# Patient Record
Sex: Female | Born: 1995 | Race: White | Hispanic: No | Marital: Single | Smoking: Never smoker
Health system: Southern US, Community
[De-identification: ages and names within clinical notes are randomized; demographics above are authoritative.]

## PROBLEM LIST (undated history)

## (undated) DIAGNOSIS — R51 Headache: Secondary | ICD-10-CM

## (undated) HISTORY — DX: Headache: R51

---
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
## 2017-03-31 DIAGNOSIS — S060X0A Concussion without loss of consciousness, initial encounter: Secondary | ICD-10-CM | POA: Diagnosis not present

## 2017-03-31 DIAGNOSIS — S0033XA Contusion of nose, initial encounter: Secondary | ICD-10-CM | POA: Diagnosis not present

## 2017-06-07 ENCOUNTER — Ambulatory Visit: Payer: BLUE CROSS/BLUE SHIELD | Admitting: Emergency Medicine

## 2017-06-07 ENCOUNTER — Encounter: Payer: Self-pay | Admitting: Emergency Medicine

## 2017-06-07 ENCOUNTER — Other Ambulatory Visit: Payer: Self-pay

## 2017-06-07 VITALS — BP 106/70 | HR 100 | Temp 98.2°F | Resp 16 | Ht 63.25 in | Wt 154.4 lb

## 2017-06-07 DIAGNOSIS — R6889 Other general symptoms and signs: Secondary | ICD-10-CM | POA: Insufficient documentation

## 2017-06-07 DIAGNOSIS — J111 Influenza due to unidentified influenza virus with other respiratory manifestations: Secondary | ICD-10-CM | POA: Diagnosis not present

## 2017-06-07 LAB — POC INFLUENZA A&B (BINAX/QUICKVUE)
Influenza A, POC: POSITIVE — AB
Influenza B, POC: NEGATIVE

## 2017-06-07 MED ORDER — ALBUTEROL SULFATE HFA 108 (90 BASE) MCG/ACT IN AERS: 2.0000 | INHALATION_SPRAY | Freq: Four times a day (QID) | RESPIRATORY_TRACT | 0 refills | Status: AC | PRN

## 2017-06-07 MED ORDER — OSELTAMIVIR PHOSPHATE 75 MG PO CAPS: 75.0000 mg | ORAL_CAPSULE | Freq: Two times a day (BID) | ORAL | 0 refills | Status: AC

## 2017-06-07 NOTE — Progress Notes (Signed)
Kathryn Spencer 21 y.o.   Chief Complaint  Patient presents with  . Cough    NONPRODUCTIVE x 2 days  . Sinus Problem    with nasal drainage    HISTORY OF PRESENT ILLNESS: This is a 22 y.o. female complaining of sore throat, cough productive of clear mucus, runny nose, low-grade fever that started 2 days ago.  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  Went to see school nurse last Monday and instructed to take ibuprofen as needed.  Feels a little worse today.  Healthy patient with no chronic medical problems.  Influenza  This is a new problem. The current episode started in the past 7 days. The problem occurs constantly. The problem has been gradually worsening. Associated symptoms include congestion, coughing, a fever, myalgias, a sore throat and weakness. Pertinent negatives include no abdominal pain, anorexia, arthralgias, change in bowel habit, chest pain, chills, fatigue, headaches, joint swelling, nausea, neck pain, rash, swollen glands, urinary symptoms, vertigo, visual change or vomiting. Nothing aggravates the symptoms. She has tried NSAIDs for the symptoms. The treatment provided no relief.     Prior to Admission medications   Medication Sig Start Date End Date Taking? Authorizing Provider  cefUROXime (CEFTIN) 500 MG tablet  11/20/12   [provider]  clindamycin-benzoyl peroxide (BENZACLIN) gel  11/21/12   [provider]  ondansetron (ZOFRAN ODT) 4 MG disintegrating tablet Take 1 tablet (4 mg total) by mouth every 8 (eight) hours as needed for nausea or vomiting. Patient not taking: Reported on 06/07/2017 01/09/13   Elveria Rising, NP  topiramate (TOPAMAX) 25 MG tablet Take 1 tablet in the morning and 2 tablets at bedtime Patient not taking: Reported on 06/07/2017 02/07/13   Elveria Rising, NP  triamcinolone (KENALOG) 0.1 % paste  11/26/12   [provider]    No Known Allergies  Patient Active Problem List   Diagnosis Date Noted  . Migraine without aura,  without mention of intractable migraine without mention of status migrainosus 01/03/2013  . Chronic tension type headache 01/03/2013  . Cold hands and feet without peripheral vascular disease 01/03/2013    Past Medical History:  Diagnosis Date  . ZOXWRUEA(540.9)     Past Surgical History:  Procedure Laterality Date  . TONSILLECTOMY AND ADENOIDECTOMY  2008    Social History   Socioeconomic History  . Marital status: Single    Spouse name: Not on file  . Number of children: Not on file  . Years of education: Not on file  . Highest education level: Not on file  Occupational History  . Not on file  Social Needs  . Financial resource strain: Not on file  . Food insecurity:    Worry: Not on file    Inability: Not on file  . Transportation needs:    Medical: Not on file    Non-medical: Not on file  Tobacco Use  . Smoking status: Never Smoker  . Smokeless tobacco: Never Used  Substance and Sexual Activity  . Alcohol use: No  . Drug use: No  . Sexual activity: Never  Lifestyle  . Physical activity:    Days per week: Not on file    Minutes per session: Not on file  . Stress: Not on file  Relationships  . Social connections:    Talks on phone: Not on file    Gets together: Not on file    Attends religious service: Not on file    Active member of club or organization: Not on  file    Attends meetings of clubs or organizations: Not on file    Relationship status: Not on file  . Intimate partner violence:    Fear of current or ex partner: Not on file    Emotionally abused: Not on file    Physically abused: Not on file    Forced sexual activity: Not on file  Other Topics Concern  . Not on file  Social History Narrative  . Not on file    No family history on file.   Review of Systems  Constitutional: Positive for fever. Negative for chills and fatigue.  HENT: Positive for congestion and sore throat.   Eyes: Negative.  Negative for blurred vision and double vision.   Respiratory: Positive for cough.   Cardiovascular: Negative.  Negative for chest pain.  Gastrointestinal: Negative.  Negative for abdominal pain, anorexia, change in bowel habit, diarrhea, nausea and vomiting.  Musculoskeletal: Positive for myalgias. Negative for arthralgias, joint swelling and neck pain.  Skin: Negative for rash.  Neurological: Positive for weakness. Negative for dizziness, vertigo and headaches.  Endo/Heme/Allergies: Negative.   All other systems reviewed and are negative.   Vitals:   06/07/17 1208  BP: 106/70  Pulse: 100  Resp: 16  Temp: 98.2 F (36.8 C)  SpO2: 99%    Physical Exam  Constitutional: She is oriented to person, place, and time. She appears well-developed and well-nourished.  HENT:  Head: Normocephalic and atraumatic.  Right Ear: External ear normal.  Left Ear: External ear normal.  Nose: Nose normal.  Mouth/Throat: Oropharynx is clear and moist.  Eyes: Pupils are equal, round, and reactive to light. Conjunctivae and EOM are normal.  Neck: Normal range of motion. Neck supple. No thyromegaly present.  Cardiovascular: Normal rate, regular rhythm and normal heart sounds.  Pulmonary/Chest: Effort normal and breath sounds normal.  Abdominal: Soft. Bowel sounds are normal. She exhibits no distension. There is no tenderness.  Lymphadenopathy:    She has no cervical adenopathy.  Neurological: She is alert and oriented to person, place, and time. No sensory deficit. She exhibits normal muscle tone.  Skin: Skin is warm and dry. Capillary refill takes less than 2 seconds.  Psychiatric: She has a normal mood and affect. Her behavior is normal.  Vitals reviewed.    ASSESSMENT & PLAN: Kathryn Spencer was seen today for cough and sinus problem.  Diagnoses and all orders for this visit:  Influenza with respiratory manifestation -     oseltamivir (TAMIFLU) 75 MG capsule; Take 1 capsule (75 mg total) by mouth 2 (two) times daily for 5 days.  Flu-like  symptoms -     POC Influenza A&B(BINAX/QUICKVUE)    Patient Instructions       IF you received an x-ray today, you will receive an invoice from [facility]. Please contact [facility] at [phone] with questions or concerns regarding your invoice.   IF you received labwork today, you will receive an invoice from [facility]. Please contact [facility] at [phone] with questions or concerns regarding your invoice.   Our billing staff will not be able to assist you with questions regarding bills from these companies.  You will be contacted with the lab results as soon as they are available. The fastest way to get your results is to activate your My Chart account. Instructions are located on the last page of this paperwork. If you have not heard from [place] regarding the results in 2 weeks, please contact this office.     Influenza,  Adult Influenza ("the flu") is an infection in the lungs, nose, and throat (respiratory tract). It is caused by a virus. The flu causes many common cold symptoms, as well as a high fever and body aches. It can make you feel very sick. The flu spreads easily from person to person (is contagious). Getting a flu shot (influenza vaccination) every year is the best way to prevent the flu. Follow these instructions at home:  Take over-the-counter and prescription medicines only as told by your doctor.  Use a cool mist humidifier to add moisture (humidity) to the air in your home. This can make it easier to breathe.  Rest as needed.  Drink enough fluid to keep your pee (urine) clear or pale yellow.  Cover your mouth and nose when you cough or sneeze.  Wash your hands with soap and water often, especially after you cough or sneeze. If you cannot use soap and water, use hand sanitizer.  Stay home from work or school as told by your doctor. Unless you are visiting your doctor, try to avoid leaving home until your fever has been gone for 24 hours  without the use of medicine.  Keep all follow-up visits as told by your doctor. This is important. How is this prevented?  Getting a yearly (annual) flu shot is the best way to avoid getting the flu. You may get the flu shot in late summer, fall, or winter. Ask your doctor when you should get your flu shot.  Wash your hands often or use hand sanitizer often.  Avoid contact with people who are sick during cold and flu season.  Eat healthy foods.  Drink plenty of fluids.  Get enough sleep.  Exercise regularly. Contact a doctor if:  You get new symptoms.  You have: ? Chest pain. ? Watery poop (diarrhea). ? A fever.  Your cough gets worse.  You start to have more mucus.  You feel sick to your stomach (nauseous).  You throw up (vomit). Get help right away if:  You start to be short of breath or have trouble breathing.  Your skin or nails turn a bluish color.  You have very bad pain or stiffness in your neck.  You get a sudden headache.  You get sudden pain in your face or ear.  You cannot stop throwing up. This information is not intended to replace advice given to you by your health care provider. Make sure you discuss any questions you have with your health care provider. Document Released: 12/01/2007 Document Revised: 07/30/2015 Document Reviewed: 12/16/2014 Elsevier Interactive Patient Education  2017 Elsevier Inc.      Edwina BarthMiguel Brenna Friesenhahn, MD [facility]

## 2017-06-07 NOTE — Patient Instructions (Addendum)
     IF you received an x-ray today, you will receive an invoice from [facility]. Please contact [facility] at [phone] with questions or concerns regarding your invoice.   IF you received labwork today, you will receive an invoice from [facility]. Please contact [facility] at [phone] with questions or concerns regarding your invoice.   Our billing staff will not be able to assist you with questions regarding bills from these companies.  You will be contacted with the lab results as soon as they are available. The fastest way to get your results is to activate your My Chart account. Instructions are located on the last page of this paperwork. If you have not heard from us regarding the results in 2 weeks, please contact this office.      Influenza, Adult Influenza ("the flu") is an infection in the lungs, nose, and throat (respiratory tract). It is caused by a virus. The flu causes many common cold symptoms, as well as a high fever and body aches. It can make you feel very sick. The flu spreads easily from person to person (is contagious). Getting a flu shot (influenza vaccination) every year is the best way to prevent the flu. Follow these instructions at home:  Take over-the-counter and prescription medicines only as told by your doctor.  Use a cool mist humidifier to add moisture (humidity) to the air in your home. This can make it easier to breathe.  Rest as needed.  Drink enough fluid to keep your pee (urine) clear or pale yellow.  Cover your mouth and nose when you cough or sneeze.  Wash your hands with soap and water often, especially after you cough or sneeze. If you cannot use soap and water, use hand sanitizer.  Stay home from work or school as told by your doctor. Unless you are visiting your doctor, try to avoid leaving home until your fever has been gone for 24 hours without the use of medicine.  Keep all follow-up visits as told by your doctor.  This is important. How is this prevented?  Getting a yearly (annual) flu shot is the best way to avoid getting the flu. You may get the flu shot in late summer, fall, or winter. Ask your doctor when you should get your flu shot.  Wash your hands often or use hand sanitizer often.  Avoid contact with people who are sick during cold and flu season.  Eat healthy foods.  Drink plenty of fluids.  Get enough sleep.  Exercise regularly. Contact a doctor if:  You get new symptoms.  You have:  Chest pain.  Watery poop (diarrhea).  A fever.  Your cough gets worse.  You start to have more mucus.  You feel sick to your stomach (nauseous).  You throw up (vomit). Get help right away if:  You start to be short of breath or have trouble breathing.  Your skin or nails turn a bluish color.  You have very bad pain or stiffness in your neck.  You get a sudden headache.  You get sudden pain in your face or ear.  You cannot stop throwing up. This information is not intended to replace advice given to you by your health care provider. Make sure you discuss any questions you have with your health care provider. Document Released: 12/01/2007 Document Revised: 07/30/2015 Document Reviewed: 12/16/2014 Elsevier Interactive Patient Education  2017 [employer]  

## 2017-07-08 DIAGNOSIS — Z113 Encounter for screening for infections with a predominantly sexual mode of transmission: Secondary | ICD-10-CM | POA: Diagnosis not present

## 2017-07-08 DIAGNOSIS — Z3041 Encounter for surveillance of contraceptive pills: Secondary | ICD-10-CM | POA: Diagnosis not present

## 2019-05-16 DIAGNOSIS — F411 Generalized anxiety disorder: Secondary | ICD-10-CM | POA: Diagnosis not present

## 2019-05-16 DIAGNOSIS — F418 Other specified anxiety disorders: Secondary | ICD-10-CM | POA: Diagnosis not present

## 2019-05-16 DIAGNOSIS — F4310 Post-traumatic stress disorder, unspecified: Secondary | ICD-10-CM | POA: Diagnosis not present

## 2019-05-16 DIAGNOSIS — F515 Nightmare disorder: Secondary | ICD-10-CM | POA: Diagnosis not present

## 2019-05-31 DIAGNOSIS — R41840 Attention and concentration deficit: Secondary | ICD-10-CM | POA: Diagnosis not present

## 2019-05-31 DIAGNOSIS — F411 Generalized anxiety disorder: Secondary | ICD-10-CM | POA: Diagnosis not present

## 2019-05-31 DIAGNOSIS — F418 Other specified anxiety disorders: Secondary | ICD-10-CM | POA: Diagnosis not present

## 2019-06-28 DIAGNOSIS — F411 Generalized anxiety disorder: Secondary | ICD-10-CM | POA: Diagnosis not present

## 2019-06-28 DIAGNOSIS — F900 Attention-deficit hyperactivity disorder, predominantly inattentive type: Secondary | ICD-10-CM | POA: Diagnosis not present

## 2019-07-15 DIAGNOSIS — L700 Acne vulgaris: Secondary | ICD-10-CM | POA: Diagnosis not present

## 2019-07-22 DIAGNOSIS — F900 Attention-deficit hyperactivity disorder, predominantly inattentive type: Secondary | ICD-10-CM | POA: Diagnosis not present

## 2019-07-22 DIAGNOSIS — F411 Generalized anxiety disorder: Secondary | ICD-10-CM | POA: Diagnosis not present

## 2019-09-11 DIAGNOSIS — J329 Chronic sinusitis, unspecified: Secondary | ICD-10-CM | POA: Diagnosis not present

## 2019-09-11 DIAGNOSIS — B9789 Other viral agents as the cause of diseases classified elsewhere: Secondary | ICD-10-CM | POA: Diagnosis not present

## 2019-09-27 DIAGNOSIS — Z79899 Other long term (current) drug therapy: Secondary | ICD-10-CM | POA: Diagnosis not present

## 2019-09-27 DIAGNOSIS — R52 Pain, unspecified: Secondary | ICD-10-CM | POA: Diagnosis not present

## 2019-09-27 DIAGNOSIS — L720 Epidermal cyst: Secondary | ICD-10-CM | POA: Diagnosis not present

## 2019-09-27 DIAGNOSIS — L700 Acne vulgaris: Secondary | ICD-10-CM | POA: Diagnosis not present

## 2019-10-14 DIAGNOSIS — L700 Acne vulgaris: Secondary | ICD-10-CM | POA: Diagnosis not present

## 2019-10-14 DIAGNOSIS — Z79899 Other long term (current) drug therapy: Secondary | ICD-10-CM | POA: Diagnosis not present

## 2019-10-22 DIAGNOSIS — F900 Attention-deficit hyperactivity disorder, predominantly inattentive type: Secondary | ICD-10-CM | POA: Diagnosis not present

## 2019-10-22 DIAGNOSIS — F411 Generalized anxiety disorder: Secondary | ICD-10-CM | POA: Diagnosis not present

## 2019-11-15 DIAGNOSIS — L700 Acne vulgaris: Secondary | ICD-10-CM | POA: Diagnosis not present

## 2019-11-21 DIAGNOSIS — K529 Noninfective gastroenteritis and colitis, unspecified: Secondary | ICD-10-CM | POA: Diagnosis not present

## 2019-11-21 DIAGNOSIS — Z1152 Encounter for screening for COVID-19: Secondary | ICD-10-CM | POA: Diagnosis not present

## 2019-12-11 DIAGNOSIS — R42 Dizziness and giddiness: Secondary | ICD-10-CM | POA: Diagnosis not present

## 2019-12-11 DIAGNOSIS — T50905A Adverse effect of unspecified drugs, medicaments and biological substances, initial encounter: Secondary | ICD-10-CM | POA: Diagnosis not present

## 2019-12-11 DIAGNOSIS — Z8659 Personal history of other mental and behavioral disorders: Secondary | ICD-10-CM | POA: Diagnosis not present

## 2019-12-20 DIAGNOSIS — F411 Generalized anxiety disorder: Secondary | ICD-10-CM | POA: Diagnosis not present

## 2019-12-20 DIAGNOSIS — F900 Attention-deficit hyperactivity disorder, predominantly inattentive type: Secondary | ICD-10-CM | POA: Diagnosis not present

## 2019-12-20 DIAGNOSIS — F515 Nightmare disorder: Secondary | ICD-10-CM | POA: Diagnosis not present
